# Patient Record
Sex: Male | Born: 1995 | Hispanic: Yes | Marital: Single
Health system: Southern US, Community
[De-identification: ages and names within clinical notes are randomized; demographics above are authoritative.]

---
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
## 2016-01-20 IMAGING — CR DG HAND COMPLETE 3+V*L*
1 series · 3 of 3 positions shown · non-contrast
Comparison: No priors.

CLINICAL DATA: Laceration to the palm and distal ring finger.

EXAM:
LEFT HAND - COMPLETE 3+ VIEW

[Series 1: pa · 0.17mm/px · 3 of 3 slices shown]
[im 1/3]
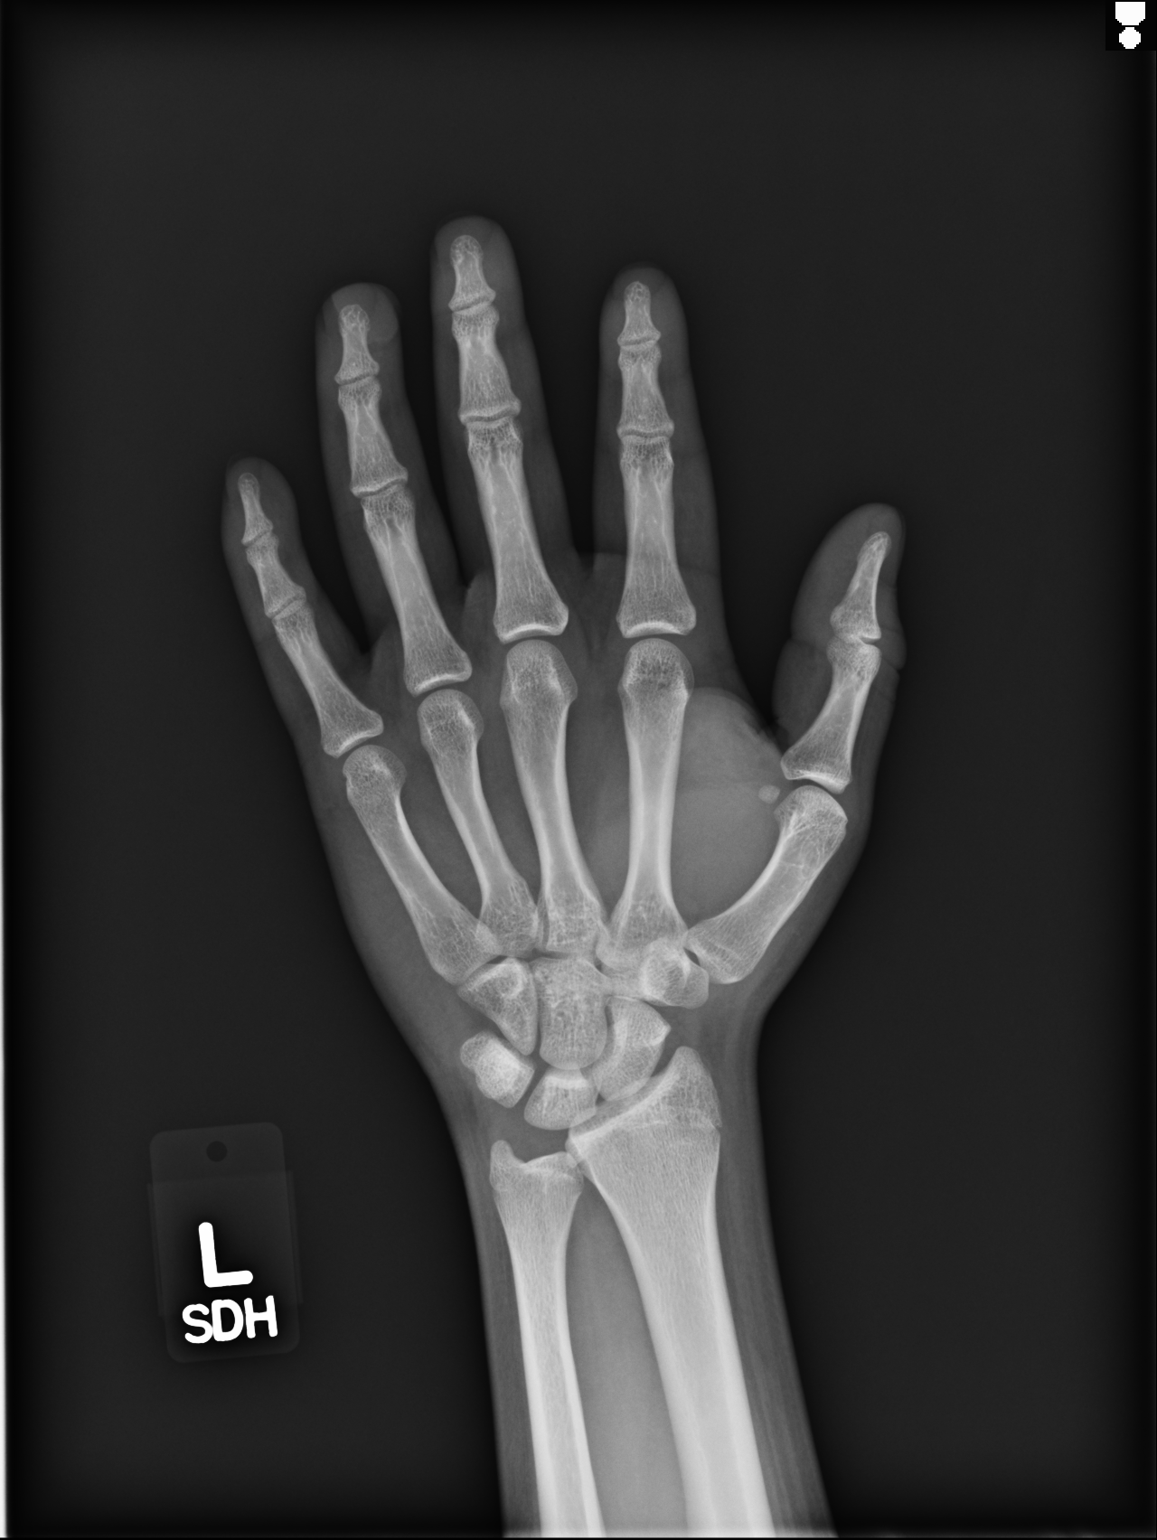
[im 2/3]
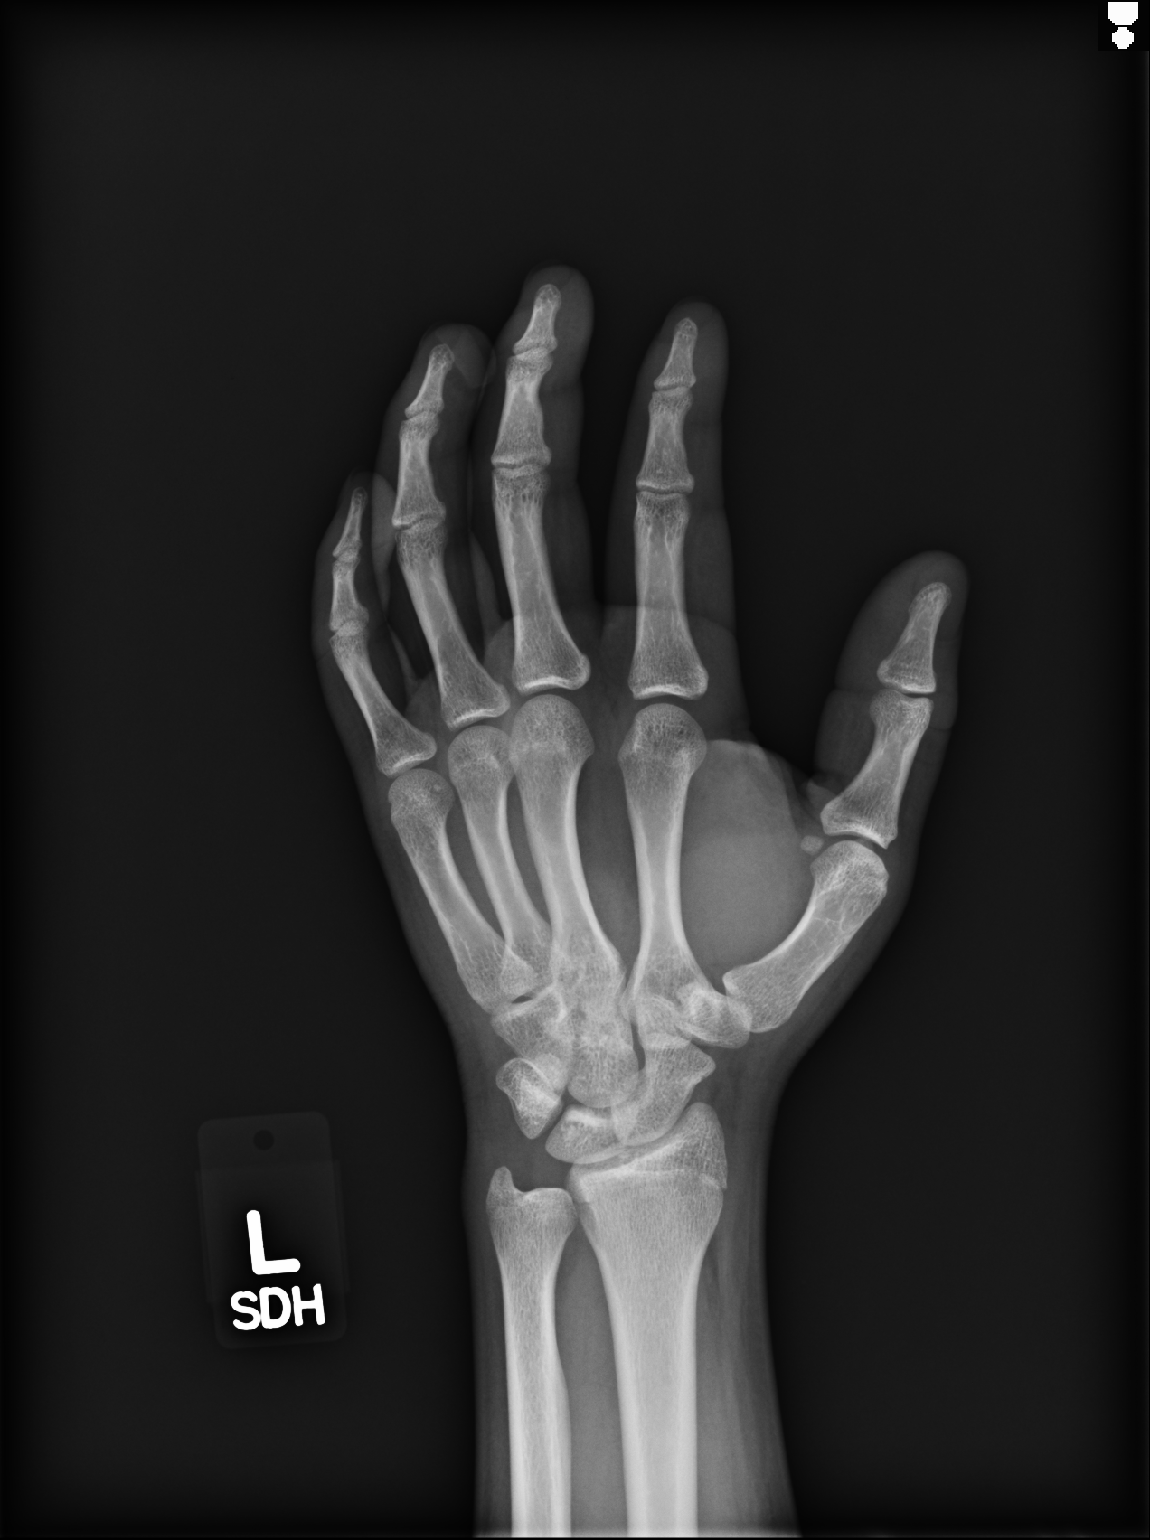
[im 3/3]
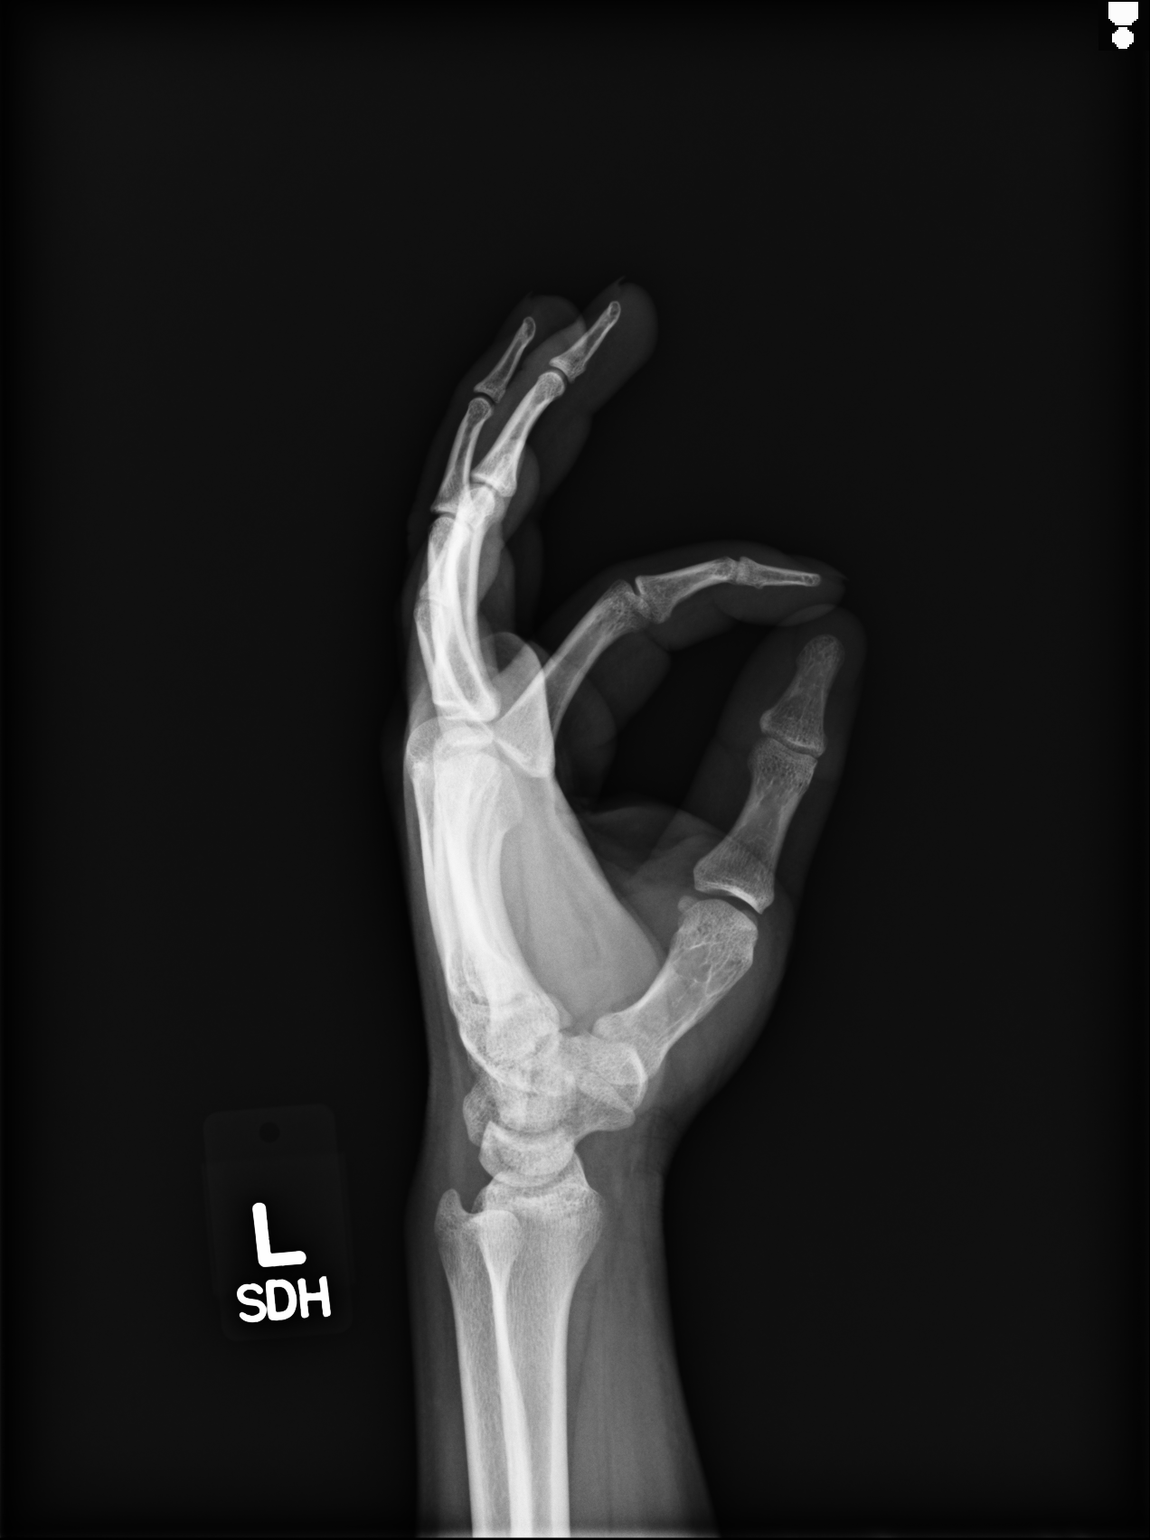

[3 of 3 positions shown; findings below may reference images not displayed]

FINDINGS: Multiple views of the left hand demonstrate no acute displaced
fracture, subluxation, dislocation, or soft tissue abnormality. No
retained radiopaque foreign body in the soft tissues.
IMPRESSION: No acute radiographic abnormality of the left hand.
